# Patient Record
Sex: Female | Born: 2005 | Race: Black or African American | Hispanic: No | Marital: Single | Smoking: Never smoker
Health system: Southern US, Community
[De-identification: ages and names within clinical notes are randomized; demographics above are authoritative.]

## PROBLEM LIST (undated history)

## (undated) DIAGNOSIS — T7801XA Anaphylactic reaction due to peanuts, initial encounter: Secondary | ICD-10-CM

## (undated) DIAGNOSIS — K219 Gastro-esophageal reflux disease without esophagitis: Secondary | ICD-10-CM

## (undated) DIAGNOSIS — J45909 Unspecified asthma, uncomplicated: Secondary | ICD-10-CM

## (undated) DIAGNOSIS — R519 Headache, unspecified: Secondary | ICD-10-CM

## (undated) HISTORY — PX: OTHER SURGICAL HISTORY: SHX169

## (undated) HISTORY — DX: Headache, unspecified: R51.9

---
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
## 2024-05-16 ENCOUNTER — Encounter: Payer: Self-pay | Admitting: Physician Assistant

## 2024-05-16 ENCOUNTER — Ambulatory Visit (INDEPENDENT_AMBULATORY_CARE_PROVIDER_SITE_OTHER): Payer: BC Managed Care – PPO | Admitting: Physician Assistant

## 2024-05-16 VITALS — BP 106/68 | HR 94 | Temp 97.7°F | Ht 64.57 in | Wt 127.0 lb

## 2024-05-16 DIAGNOSIS — H9325 Central auditory processing disorder: Secondary | ICD-10-CM | POA: Diagnosis not present

## 2024-05-16 DIAGNOSIS — N926 Irregular menstruation, unspecified: Secondary | ICD-10-CM

## 2024-05-16 DIAGNOSIS — R41840 Attention and concentration deficit: Secondary | ICD-10-CM | POA: Insufficient documentation

## 2024-05-16 DIAGNOSIS — J4530 Mild persistent asthma, uncomplicated: Secondary | ICD-10-CM | POA: Insufficient documentation

## 2024-05-16 DIAGNOSIS — Z0000 Encounter for general adult medical examination without abnormal findings: Secondary | ICD-10-CM

## 2024-05-16 DIAGNOSIS — J301 Allergic rhinitis due to pollen: Secondary | ICD-10-CM | POA: Insufficient documentation

## 2024-05-16 DIAGNOSIS — Z91013 Allergy to seafood: Secondary | ICD-10-CM | POA: Insufficient documentation

## 2024-05-16 DIAGNOSIS — R21 Rash and other nonspecific skin eruption: Secondary | ICD-10-CM | POA: Insufficient documentation

## 2024-05-16 LAB — CBC WITH DIFFERENTIAL/PLATELET
Basophils Absolute: 0 10*3/uL (ref 0.0–0.1)
Basophils Relative: 0.3 % (ref 0.0–3.0)
Eosinophils Absolute: 0.1 10*3/uL (ref 0.0–0.7)
Eosinophils Relative: 1.2 % (ref 0.0–5.0)
HCT: 38 % (ref 36.0–49.0)
Hemoglobin: 12.3 g/dL (ref 12.0–16.0)
Lymphocytes Relative: 19.8 % — ABNORMAL LOW (ref 24.0–48.0)
Lymphs Abs: 1.3 10*3/uL (ref 0.7–4.0)
MCHC: 32.3 g/dL (ref 31.0–37.0)
MCV: 81.8 fl (ref 78.0–98.0)
Monocytes Absolute: 0.5 10*3/uL (ref 0.1–1.0)
Monocytes Relative: 7 % (ref 3.0–12.0)
Neutro Abs: 4.8 10*3/uL (ref 1.4–7.7)
Neutrophils Relative %: 71.7 % — ABNORMAL HIGH (ref 43.0–71.0)
Platelets: 301 10*3/uL (ref 150.0–575.0)
RBC: 4.65 Mil/uL (ref 3.80–5.70)
RDW: 13.4 % (ref 11.4–15.5)
WBC: 6.7 10*3/uL (ref 4.5–13.5)

## 2024-05-16 LAB — COMPREHENSIVE METABOLIC PANEL WITH GFR
ALT: 18 U/L (ref 0–35)
AST: 17 U/L (ref 0–37)
Albumin: 4.6 g/dL (ref 3.5–5.2)
Alkaline Phosphatase: 48 U/L (ref 47–119)
BUN: 14 mg/dL (ref 6–23)
CO2: 24 meq/L (ref 19–32)
Calcium: 9.4 mg/dL (ref 8.4–10.5)
Chloride: 103 meq/L (ref 96–112)
Creatinine, Ser: 0.86 mg/dL (ref 0.40–1.20)
GFR: 98.1 mL/min (ref >60.00)
Glucose, Bld: 80 mg/dL (ref 70–99)
Potassium: 3.9 meq/L (ref 3.5–5.1)
Sodium: 137 meq/L (ref 135–145)
Total Bilirubin: 0.9 mg/dL (ref 0.2–1.2)
Total Protein: 6.9 g/dL (ref 6.0–8.3)

## 2024-05-16 LAB — LIPID PANEL
Cholesterol: 216 mg/dL — ABNORMAL HIGH (ref 0–200)
HDL: 62.3 mg/dL (ref >39.00)
LDL Cholesterol: 144 mg/dL — ABNORMAL HIGH (ref 0–99)
NonHDL: 153.5
Total CHOL/HDL Ratio: 3
Triglycerides: 47 mg/dL (ref 0.0–149.0)
VLDL: 9.4 mg/dL (ref 0.0–40.0)

## 2024-05-16 LAB — TSH: TSH: 1.85 u[IU]/mL (ref 0.40–5.00)

## 2024-05-16 NOTE — Patient Instructions (Signed)
  VISIT SUMMARY: Today, you had your annual physical exam and discussed your concerns about ADHD and irregular menstrual cycles. We also reviewed your history of auditory processing disorder and overall health maintenance.  YOUR PLAN: WELLNESS EXAMINATION: You are in good physical and mental health, up to date with most vaccinations and screenings, and maintain a healthy lifestyle. -We performed a physical examination. -We ordered routine blood work including cholesterol, thyroid, glucose, liver, and renal function tests.  IRREGULAR MENSTRUAL CYCLES: You have irregular menstrual cycles, with your last period in March. Previous tests for PCOS and thyroid issues were negative. Birth control previously helped regulate your cycles. -We discussed resuming birth control to help regulate your cycles. -We will contact the pharmacy for details of your previous birth control prescription. -We will send you a MyChart message with the details for your prescription renewal.  AUDITORY PROCESSING DISORDER: You have a history of auditory processing disorder, which affects your ability to focus and sit for long periods. 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 -Continue using your current accommodations and support from [employer].  SUSPECTED ADHD: You have difficulty staying on task and online tests suggest possible ADHD. You have not been treated with ADHD medication before. -We referred you to [facility] for a comprehensive evaluation                     Contains text generated by [employer]bridge.                                 Contains text generated by [employer]bridge.

## 2024-05-16 NOTE — Progress Notes (Signed)
 Patient ID: Nat DELENA Carbon, female    DOB: 2006-05-23, 18 y.o.   MRN: [id number]   Assessment & Plan:  Annual physical exam -     CBC with Differential/Platelet -     Comprehensive metabolic panel with GFR -     Lipid panel -     TSH  Auditory processing disorder -     Ambulatory referral to Psychiatry  Attention deficit -     Ambulatory referral to Psychiatry  Irregular menses      Assessment & Plan Wellness Examination 18 year old female presenting for a routine physical examination. Reports good physical and mental health. Up to date with most vaccinations and screenings required for school. Engages in regular exercise and maintains a healthy diet. No current sexual activity. Recent vision and dental checks. - Perform physical examination - Order routine blood work including cholesterol, thyroid, glucose, liver, and renal function tests  Irregular Menstrual Cycles Reports irregular menstrual cycles, with the last period in March. Previous evaluations for PCOS and thyroid issues were negative. Birth control previously regulated cycles without side effects. Discussed resuming birth control to regulate cycles and emphasized adherence for effectiveness. - Contact pharmacy for previous birth control prescription details - Send MyChart message with birth control details for prescription renewal  Auditory Processing Disorder Diagnosed with auditory processing disorder since age 57. Experiences difficulty focusing and sitting for long periods in certain settings. Utilizes accommodations such as Surveyor, quantity and specialized notebooks. Supported by [employer].  Suspected ADHD Reports difficulty staying on task and online tests suggest possible ADHD. No previous ADHD medication treatment. Referral to [facility] for comprehensive evaluation, including testing for anxiety and depression. - Refer to [facility] for ADHD evaluation and  testing      Return in about 1 year (around 05/16/2025) for physical.    Subjective:    Chief Complaint  Patient presents with   Annual Exam    Pt in office for annual CPE and fasting labs;     HPI Discussed the use of AI scribe software for clinical note transcription with the patient, who gave verbal consent to proceed.  History of Present Illness ZAKAYLA MARTINEC is a 18 year old female who presents for an annual physical exam and evaluation for ADHD.  She has a history of auditory processing disorder diagnosed at age 43, which has contributed to her difficulty focusing and sitting for long periods. These challenges have persisted into her college years, where she utilizes accommodations such as a recorder for lectures and specialized notebooks. She is interested in being evaluated for ADHD, having taken online tests that suggest this possibility.  She experiences irregular menstrual cycles, with her last period occurring in March. Her cycles were regular in her younger years but have become sporadic over time. She was previously on birth control, which helped regulate her cycles, but she has not been on it recently. Blood work for PCOS and thyroid issues returned negative results.  She experiences occasional headaches, which have been present for several years and are not related to her previous use of birth control.  She maintains a healthy lifestyle, going to the gym a couple of times a week, focusing on legs, arms, back, and cardio exercises like treadmill or elliptical. Her nutrition is good, avoiding greasy and fatty foods. She recently had a dental check-up and her last vision check was a few months ago. She wears glasses for driving but not regularly.  She  is not sexually active and has never been. She does not smoke or vape. 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 She is currently applying for jobs and took a summer  class.     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    Asthma    Constipation    Frequent headaches    GERD (gastroesophageal reflux disease)     Past Surgical History:  Procedure Laterality Date   wisdom teeth Bilateral     Family History  Problem Relation Age of Onset   Hypertension Mother    High Cholesterol Mother    Hypertension Father    Diabetes Maternal Grandmother    Hypertension Maternal Grandmother    Heart attack Maternal Grandfather    Hypertension Maternal Grandfather    Hypertension Paternal Grandmother     Social History   Tobacco Use   Smoking status: Never   Smokeless tobacco: Never  Vaping Use   Vaping status: Never Used  Substance Use Topics   Alcohol use: Never   Drug use: Never     Allergies  Allergen Reactions   Fish Allergy     Strange growling voice.    Peanut-Containing Drug Products Other (See Comments)    Positive allergy test to all nuts   Shellfish Allergy     Review of Systems NEGATIVE UNLESS OTHERWISE INDICATED IN HPI      Objective:     BP 106/68 (BP Location: Left Arm, Patient Position: Sitting, Cuff Size: Normal)   Pulse 94   Temp 97.7 F (36.5 C) (Temporal)   Ht 5' 4.57 (1.64 m)   Wt 127 lb (57.6 kg)   LMP 02/04/2024 (Approximate)   SpO2 98%   BMI 21.42 kg/m   Wt Readings from Last 3 Encounters:  05/16/24 127 lb (57.6 kg) (50%, Z= 0.01)*  09/16/23 124 lb (56.2 kg) (48%, Z= -0.06)*  11/22/22 117 lb 8 oz (53.3 kg) (38%, Z= -0.31)*   * Growth percentiles are based on CDC (Girls, 2-20 Years) data.    BP Readings from Last 3 Encounters:  05/16/24 106/68  12/31/23 100/67  10/07/23 105/70     Physical Exam Vitals and nursing note reviewed.  Constitutional:      Appearance: Normal appearance. She is normal weight. She is not toxic-appearing.  HENT:     Head: Normocephalic and atraumatic.     Right Ear: Tympanic membrane, ear canal and external ear normal.      Left Ear: Tympanic membrane, ear canal and external ear normal.     Nose: Nose normal.     Mouth/Throat:     Mouth: Mucous membranes are moist.   Eyes:     Extraocular Movements: Extraocular movements intact.     Conjunctiva/sclera: Conjunctivae normal.     Pupils: Pupils are equal, round, and reactive to light.    Cardiovascular:     Rate and Rhythm: Normal rate and regular rhythm.     Pulses: Normal pulses.     Heart sounds: Normal heart sounds.  Pulmonary:     Effort: Pulmonary effort is normal.     Breath sounds: Normal breath sounds.  Abdominal:     General: Abdomen is flat. Bowel sounds are normal.     Palpations: Abdomen is soft.   Musculoskeletal:        General: Normal range of motion.     Cervical back: Normal range of motion  and neck supple.     Right lower leg: No edema.     Left lower leg: No edema.   Skin:    General: Skin is warm and dry.   Neurological:     General: No focal deficit present.     Mental Status: She is alert and oriented to person, place, and time.   Psychiatric:        Mood and Affect: Mood normal.     Comments: Noticeable pauses before speaking           Ahilyn Nell M Vinia Jemmott, PA-C

## 2024-05-21 ENCOUNTER — Ambulatory Visit: Payer: Self-pay | Admitting: Physician Assistant

## 2025-05-17 ENCOUNTER — Encounter: Admitting: Physician Assistant
